# Patient Record
Sex: Female | Born: 1995 | Race: White | Hispanic: No | Marital: Single | Smoking: Never smoker
Health system: Southern US, Community
[De-identification: ages and names within clinical notes are randomized; demographics above are authoritative.]

## PROBLEM LIST (undated history)

## (undated) DIAGNOSIS — N390 Urinary tract infection, site not specified: Secondary | ICD-10-CM

## (undated) DIAGNOSIS — F32A Depression, unspecified: Secondary | ICD-10-CM

## (undated) DIAGNOSIS — F419 Anxiety disorder, unspecified: Secondary | ICD-10-CM

## (undated) DIAGNOSIS — F633 Trichotillomania: Secondary | ICD-10-CM

## (undated) DIAGNOSIS — F53 Postpartum depression: Secondary | ICD-10-CM

## (undated) HISTORY — DX: Anxiety disorder, unspecified: F41.9

## (undated) HISTORY — DX: Trichotillomania: F63.3

## (undated) HISTORY — DX: Depression, unspecified: F32.A

## (undated) HISTORY — PX: TONSILLECTOMY: SUR1361

## (undated) HISTORY — PX: ADENOIDECTOMY: SUR15

---
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
## 2016-09-16 ENCOUNTER — Emergency Department (HOSPITAL_COMMUNITY): Payer: BLUE CROSS/BLUE SHIELD

## 2016-09-16 ENCOUNTER — Encounter (HOSPITAL_COMMUNITY): Payer: Self-pay | Admitting: Emergency Medicine

## 2016-09-16 ENCOUNTER — Emergency Department (HOSPITAL_COMMUNITY)
Admission: EM | Admit: 2016-09-16 | Discharge: 2016-09-16 | Disposition: A | Discharge status: Home / Self Care | Payer: BLUE CROSS/BLUE SHIELD | Attending: Emergency Medicine | Admitting: Emergency Medicine

## 2016-09-16 DIAGNOSIS — R102 Pelvic and perineal pain: Secondary | ICD-10-CM | POA: Insufficient documentation

## 2016-09-16 DIAGNOSIS — O23591 Infection of other part of genital tract in pregnancy, first trimester: Secondary | ICD-10-CM | POA: Insufficient documentation

## 2016-09-16 DIAGNOSIS — O26891 Other specified pregnancy related conditions, first trimester: Secondary | ICD-10-CM | POA: Diagnosis present

## 2016-09-16 DIAGNOSIS — N760 Acute vaginitis: Secondary | ICD-10-CM

## 2016-09-16 DIAGNOSIS — B9689 Other specified bacterial agents as the cause of diseases classified elsewhere: Secondary | ICD-10-CM

## 2016-09-16 DIAGNOSIS — N3001 Acute cystitis with hematuria: Secondary | ICD-10-CM

## 2016-09-16 DIAGNOSIS — R52 Pain, unspecified: Secondary | ICD-10-CM

## 2016-09-16 DIAGNOSIS — O2311 Infections of bladder in pregnancy, first trimester: Secondary | ICD-10-CM | POA: Diagnosis not present

## 2016-09-16 DIAGNOSIS — Z3A01 Less than 8 weeks gestation of pregnancy: Secondary | ICD-10-CM | POA: Insufficient documentation

## 2016-09-16 HISTORY — DX: Urinary tract infection, site not specified: N39.0

## 2016-09-16 LAB — URINALYSIS, ROUTINE W REFLEX MICROSCOPIC
BILIRUBIN URINE: NEGATIVE
Glucose, UA: NEGATIVE mg/dL
Ketones, ur: NEGATIVE mg/dL
Leukocytes, UA: NEGATIVE
NITRITE: NEGATIVE
PH: 7.5 (ref 5.0–8.0)
Protein, ur: 30 mg/dL — AB
SPECIFIC GRAVITY, URINE: 1.01 (ref 1.005–1.030)

## 2016-09-16 LAB — CBC WITH DIFFERENTIAL/PLATELET
BASOS ABS: 0 10*3/uL (ref 0.0–0.1)
Basophils Relative: 0 %
EOS PCT: 2 %
Eosinophils Absolute: 0.2 10*3/uL (ref 0.0–0.7)
HEMATOCRIT: 40.9 % (ref 36.0–46.0)
Hemoglobin: 14.2 g/dL (ref 12.0–15.0)
LYMPHS ABS: 3.2 10*3/uL (ref 0.7–4.0)
LYMPHS PCT: 26 %
MCH: 31.4 pg (ref 26.0–34.0)
MCHC: 34.7 g/dL (ref 30.0–36.0)
MCV: 90.5 fL (ref 78.0–100.0)
MONO ABS: 1 10*3/uL (ref 0.1–1.0)
MONOS PCT: 9 %
NEUTROS ABS: 7.8 10*3/uL — AB (ref 1.7–7.7)
Neutrophils Relative %: 63 %
PLATELETS: 257 10*3/uL (ref 150–400)
RBC: 4.52 MIL/uL (ref 3.87–5.11)
RDW: 12.3 % (ref 11.5–15.5)
WBC: 12.3 10*3/uL — ABNORMAL HIGH (ref 4.0–10.5)

## 2016-09-16 LAB — WET PREP, GENITAL
Sperm: NONE SEEN
Trich, Wet Prep: NONE SEEN
Yeast Wet Prep HPF POC: NONE SEEN

## 2016-09-16 LAB — BASIC METABOLIC PANEL
ANION GAP: 7 (ref 5–15)
BUN: 12 mg/dL (ref 6–20)
CALCIUM: 9 mg/dL (ref 8.9–10.3)
CO2: 23 mmol/L (ref 22–32)
Chloride: 105 mmol/L (ref 101–111)
Creatinine, Ser: 0.67 mg/dL (ref 0.44–1.00)
GFR calc Af Amer: >60 mL/min (ref >60)
GFR calc non Af Amer: >60 mL/min (ref >60)
GLUCOSE: 74 mg/dL (ref 65–99)
Potassium: 3.5 mmol/L (ref 3.5–5.1)
Sodium: 135 mmol/L (ref 135–145)

## 2016-09-16 LAB — URINE MICROSCOPIC-ADD ON

## 2016-09-16 LAB — ABO/RH: ABO/RH(D): O POS

## 2016-09-16 LAB — PREGNANCY, URINE: PREG TEST UR: POSITIVE — AB

## 2016-09-16 LAB — HCG, QUANTITATIVE, PREGNANCY: hCG, Beta Chain, Quant, S: 19930 m[IU]/mL — ABNORMAL HIGH (ref <5)

## 2016-09-16 MED ORDER — NITROFURANTOIN MONOHYD MACRO 100 MG PO CAPS: 100.0000 mg | ORAL_CAPSULE | Freq: Two times a day (BID) | ORAL | 0 refills | Status: DC

## 2016-09-16 MED ORDER — PRENATAL COMPLETE 14-0.4 MG PO TABS: 1.0000 | ORAL_TABLET | Freq: Every day | ORAL | 0 refills | Status: DC

## 2016-09-16 MED ORDER — CEPHALEXIN 500 MG PO CAPS: 500.0000 mg | ORAL_CAPSULE | Freq: Four times a day (QID) | ORAL | 0 refills | Status: DC

## 2016-09-16 NOTE — ED Provider Notes (Signed)
[facility] DEPT Provider Note   CSN: [id number] Arrival date & time: 09/16/16  1505     History   Chief Complaint Chief Complaint  Patient presents with  . Hematuria    HPI Ashley Brady is a 20 y.o. female, G1P0, Recently found to be pregnant, stating she had a hCG test completed at her [facility] care's [facility] and was told she is roughly 4-5 weeks, her LMP being 6 weeks ago.  She has had a one-week history of intermittent low pelvic cramping which will last for 15-30 seconds, be quite severe and then will be pain-free and was to be triggered with more active activities like walking but can also occur at rest.  She also endorses darker than normal urine and several episodes of blood tinged toilet paper on wiping, but denies menstrual spotting as she only sees blood with urination.  She denies dysuria, increased frequency, or urgency.  She has no fevers or chills, denies low back pain.  She also denies vaginal discharge.  She does have a history of frequent UTIs and states that this pain is different from a UTI.  She has had no medications for her symptoms prior to arrival.  She is planning to establish OB care in 5 days [facility]  The history is provided by the patient and a parent.    Past Medical History:  Diagnosis Date  . Recurrent UTI     There are no active problems to display for this patient.   Past Surgical History:  Procedure Laterality Date  . TONSILLECTOMY      OB History    Gravida Para Term Preterm AB Living   1             SAB TAB Ectopic Multiple Live Births                   Home Medications    Prior to Admission medications   Medication Sig Start Date End Date Taking? Authorizing Provider  cephALEXin (KEFLEX) 500 MG capsule Take 1 capsule (500 mg total) by mouth 4 (four) times daily. 09/16/16   Burgess AmorJulie Alva Kuenzel, PA-C  HYDROcodone-acetaminophen (NORCO) 5-325 MG per tablet Take 1 tablet by mouth every  6 (six) hours as needed for moderate pain. 11/17/13   Ivery QualeHobson Bryant, PA-C  ibuprofen (ADVIL,MOTRIN) 800 MG tablet Take 1 tablet (800 mg total) by mouth 3 (three) times daily. 11/17/13   Ivery QualeHobson Bryant, PA-C  Prenatal Vit-Fe Fumarate-FA (PRENATAL COMPLETE) 14-0.4 MG TABS Take 1 tablet by mouth daily. 09/16/16   Burgess AmorJulie Quanika Solem, PA-C    Family History Family History  Problem Relation Age of Onset  . Thyroid disease Mother     Social History Social History  Substance Use Topics  . Smoking status: Never Smoker  . Smokeless tobacco: Never Used  . Alcohol use No     Allergies   Sulfa antibiotics   Review of Systems Review of Systems  Constitutional: Negative for appetite change and fever.  HENT: Negative for congestion and sore throat.   Eyes: Negative.   Respiratory: Negative for chest tightness and shortness of breath.   Cardiovascular: Negative for chest pain.  Gastrointestinal: Negative for abdominal pain, nausea and vomiting.  Genitourinary: Positive for hematuria and pelvic pain. Negative for dysuria, frequency, urgency, vaginal discharge and vaginal pain.  Musculoskeletal: Negative for arthralgias, back pain, joint swelling and neck pain.  Skin: Negative.  Negative for rash and wound.  Neurological:  Negative for dizziness, weakness, light-headedness, numbness and headaches.  Psychiatric/Behavioral: Negative.      Physical Exam Updated Vital Signs BP 127/59 (BP Location: Right Arm)   Pulse 66   Temp 98.6 F (37 C) (Oral)   Resp 18   Ht 5\' 9"  (1.753 m)   Wt 97.1 kg   LMP 08/03/2016   SpO2 100%   BMI 31.60 kg/m   Physical Exam  Constitutional: She appears well-developed and well-nourished.  HENT:  Head: Normocephalic and atraumatic.  Eyes: Conjunctivae are normal.  Neck: Normal range of motion.  Cardiovascular: Normal rate, regular rhythm, normal heart sounds and intact distal pulses.   Pulmonary/Chest: Effort normal and breath sounds normal. She has no wheezes.    Abdominal: Soft. Bowel sounds are normal. There is no tenderness.  Genitourinary: Vagina normal and uterus normal. Uterus is not tender. Cervix exhibits no motion tenderness, no discharge and no friability. Right adnexum displays no tenderness. Left adnexum displays no tenderness. No tenderness or bleeding in the vagina. No vaginal discharge found.  Musculoskeletal: Normal range of motion.  Neurological: She is alert.  Skin: Skin is warm and dry.  Psychiatric: She has a normal mood and affect.  Nursing note and vitals reviewed.    ED Treatments / Results  Labs Results for orders placed or performed during the hospital encounter of 09/16/16  Wet prep, genital  Result Value Ref Range   Yeast Wet Prep HPF POC NONE SEEN NONE SEEN   Trich, Wet Prep NONE SEEN NONE SEEN   Clue Cells Wet Prep HPF POC PRESENT (A) NONE SEEN   WBC, Wet Prep HPF POC MANY (A) NONE SEEN   Sperm NONE SEEN   Urinalysis, Routine w reflex microscopic- may I&O cath if menses  Result Value Ref Range   Color, Urine YELLOW YELLOW   APPearance HAZY (A) CLEAR   Specific Gravity, Urine 1.010 1.005 - 1.030   pH 7.5 5.0 - 8.0   Glucose, UA NEGATIVE NEGATIVE mg/dL   Hgb urine dipstick LARGE (A) NEGATIVE   Bilirubin Urine NEGATIVE NEGATIVE   Ketones, ur NEGATIVE NEGATIVE mg/dL   Protein, ur 30 (A) NEGATIVE mg/dL   Nitrite NEGATIVE NEGATIVE   Leukocytes, UA NEGATIVE NEGATIVE  Pregnancy, urine  Result Value Ref Range   Preg Test, Ur POSITIVE (A) NEGATIVE  Urine microscopic-add on  Result Value Ref Range   Squamous Epithelial / LPF 6-30 (A) NONE SEEN   WBC, UA 0-5 0 - 5 WBC/hpf   RBC / HPF 6-30 0 - 5 RBC/hpf   Bacteria, UA MANY (A) NONE SEEN   Urine-Other AMORPHOUS URATES/PHOSPHATES   hCG, quantitative, pregnancy  Result Value Ref Range   hCG, Beta Chain, Quant, S 19,930 (H) <5 mIU/mL  CBC with Differential  Result Value Ref Range   WBC 12.3 (H) 4.0 - 10.5 K/uL   RBC 4.52 3.87 - 5.11 MIL/uL   Hemoglobin 14.2 12.0  - 15.0 g/dL   HCT 16.1 09.6 - 04.5 %   MCV 90.5 78.0 - 100.0 fL   MCH 31.4 26.0 - 34.0 pg   MCHC 34.7 30.0 - 36.0 g/dL   RDW 40.9 81.1 - 91.4 %   Platelets 257 150 - 400 K/uL   Neutrophils Relative % 63 %   Neutro Abs 7.8 (H) 1.7 - 7.7 K/uL   Lymphocytes Relative 26 %   Lymphs Abs 3.2 0.7 - 4.0 K/uL   Monocytes Relative 9 %   Monocytes Absolute 1.0 0.1 - 1.0 K/uL  Eosinophils Relative 2 %   Eosinophils Absolute 0.2 0.0 - 0.7 K/uL   Basophils Relative 0 %   Basophils Absolute 0.0 0.0 - 0.1 K/uL  Basic metabolic panel  Result Value Ref Range   Sodium 135 135 - 145 mmol/L   Potassium 3.5 3.5 - 5.1 mmol/L   Chloride 105 101 - 111 mmol/L   CO2 23 22 - 32 mmol/L   Glucose, Bld 74 65 - 99 mg/dL   BUN 12 6 - 20 mg/dL   Creatinine, Ser 1.61 0.44 - 1.00 mg/dL   Calcium 9.0 8.9 - 09.6 mg/dL   GFR calc non Af Amer >60 >60 mL/min   GFR calc Af Amer >60 >60 mL/min   Anion gap 7 5 - 15  ABO/Rh  Result Value Ref Range   ABO/RH(D) O POS    No rh immune globuloin NOT A RH IMMUNE GLOBULIN CANDIDATE, PT RH POSITIVE    US Ob Comp Less 14 Wks  Result Date: 09/16/2016 CLINICAL DATA:  Pelvic pain, cramping, and hematuria for 1 week. Gestational age by LMP of 5 weeks 6 days. EXAM: OBSTETRIC <14 WK [place] AND TRANSVAGINAL OB US TECHNIQUE: Both transabdominal and transvaginal ultrasound examinations were performed for complete evaluation of the gestation as well as the maternal uterus, adnexal regions, and pelvic cul-de-sac. Transvaginal technique was performed to assess early pregnancy. COMPARISON:  None. FINDINGS: Intrauterine gestational sac: Single Yolk sac:  Visualized Embryo:  Visualized Cardiac Activity: Visualized Heart Rate: 150  bpm CRL:  6  mm   6 w   2 d                  [place] EDC: 05/10/2017 Subchorionic hemorrhage:  None visualized. Maternal uterus/adnexae: Both ovaries are normal in appearance. No evidence of mass or free fluid. IMPRESSION: Single living IUP measuring 6 weeks 2 days with [place] EDC  of 05/10/2017. This is concordant with LMP. No significant maternal uterine or adnexal abnormality identified. Electronically Signed   By: Myles Rosenthal M.D.   On: 09/16/2016 20:01   US Ob Transvaginal  Result Date: 09/16/2016 CLINICAL DATA:  Pelvic pain, cramping, and hematuria for 1 week. Gestational age by LMP of 5 weeks 6 days. EXAM: OBSTETRIC <14 WK [place] AND TRANSVAGINAL OB US TECHNIQUE: Both transabdominal and transvaginal ultrasound examinations were performed for complete evaluation of the gestation as well as the maternal uterus, adnexal regions, and pelvic cul-de-sac. Transvaginal technique was performed to assess early pregnancy. COMPARISON:  None. FINDINGS: Intrauterine gestational sac: Single Yolk sac:  Visualized Embryo:  Visualized Cardiac Activity: Visualized Heart Rate: 150  bpm CRL:  6  mm   6 w   2 d                  [place] EDC: 05/10/2017 Subchorionic hemorrhage:  None visualized. Maternal uterus/adnexae: Both ovaries are normal in appearance. No evidence of mass or free fluid. IMPRESSION: Single living IUP measuring 6 weeks 2 days with [place] EDC of 05/10/2017. This is concordant with LMP. No significant maternal uterine or adnexal abnormality identified. Electronically Signed   By: Myles Rosenthal M.D.   On: 09/16/2016 20:01    EKG  EKG Interpretation None       Radiology US Ob Comp Less 14 Wks  Result Date: 09/16/2016 CLINICAL DATA:  Pelvic pain, cramping, and hematuria for 1 week. Gestational age by LMP of 5 weeks 6 days. EXAM: OBSTETRIC <14 WK [place] AND TRANSVAGINAL OB US TECHNIQUE: Both transabdominal and transvaginal  ultrasound examinations were performed for complete evaluation of the gestation as well as the maternal uterus, adnexal regions, and pelvic cul-de-sac. Transvaginal technique was performed to assess early pregnancy. COMPARISON:  None. FINDINGS: Intrauterine gestational sac: Single Yolk sac:  Visualized Embryo:  Visualized Cardiac Activity: Visualized Heart Rate: 150  bpm CRL:   6  mm   6 w   2 d                  [place] EDC: 05/10/2017 Subchorionic hemorrhage:  None visualized. Maternal uterus/adnexae: Both ovaries are normal in appearance. No evidence of mass or free fluid. IMPRESSION: Single living IUP measuring 6 weeks 2 days with [place] EDC of 05/10/2017. This is concordant with LMP. No significant maternal uterine or adnexal abnormality identified. Electronically Signed   By: Myles Rosenthal M.D.   On: 09/16/2016 20:01   US Ob Transvaginal  Result Date: 09/16/2016 CLINICAL DATA:  Pelvic pain, cramping, and hematuria for 1 week. Gestational age by LMP of 5 weeks 6 days. EXAM: OBSTETRIC <14 WK [place] AND TRANSVAGINAL OB US TECHNIQUE: Both transabdominal and transvaginal ultrasound examinations were performed for complete evaluation of the gestation as well as the maternal uterus, adnexal regions, and pelvic cul-de-sac. Transvaginal technique was performed to assess early pregnancy. COMPARISON:  None. FINDINGS: Intrauterine gestational sac: Single Yolk sac:  Visualized Embryo:  Visualized Cardiac Activity: Visualized Heart Rate: 150  bpm CRL:  6  mm   6 w   2 d                  [place] EDC: 05/10/2017 Subchorionic hemorrhage:  None visualized. Maternal uterus/adnexae: Both ovaries are normal in appearance. No evidence of mass or free fluid. IMPRESSION: Single living IUP measuring 6 weeks 2 days with [place] EDC of 05/10/2017. This is concordant with LMP. No significant maternal uterine or adnexal abnormality identified. Electronically Signed   By: Myles Rosenthal M.D.   On: 09/16/2016 20:01    Procedures Procedures (including critical care time)  Medications Ordered in ED Medications - No data to display   Initial Impression / Assessment and Plan / ED Course  I have reviewed the triage vital signs and the nursing notes.  Pertinent labs & imaging results that were available during my care of the patient were reviewed by me and considered in my medical decision making (see chart for  details).  Clinical Course    Labs and imaging reviewed and discussed with patient. She was placed on keflex for uti, culture pending, also prescribed prenatal vitamin. Discussed clue cells finding and she will probably need tx for this, but will defer to her prenatal provider next week. She was given copy of her labs and [place] report to give to her new provider and advised to discuss this tx. Questions answered.  Pt stable at time of dc.   Final Clinical Impressions(s) / ED Diagnoses   Final diagnoses:  Pelvic pain  Acute cystitis with hematuria  Bacterial vaginosis  Less than [redacted] weeks gestation of pregnancy    New Prescriptions Discharge Medication List as of 09/16/2016  9:09 PM    START taking these medications   Details  cephALEXin (KEFLEX) 500 MG capsule Take 1 capsule (500 mg total) by mouth 4 (four) times daily., Starting Thu 09/16/2016, Print         Burgess Amor, PA-C 09/17/16 9604    Pricilla Loveless, MD 09/21/16 2328

## 2016-09-16 NOTE — ED Triage Notes (Signed)
Pt reports abdominal cramping and hematuria that started 1 week ago. Pt states she is [redacted] weeks pregnant.

## 2016-09-17 LAB — GC/CHLAMYDIA PROBE AMP (~~LOC~~) NOT AT ARMC
CHLAMYDIA, DNA PROBE: NEGATIVE
Neisseria Gonorrhea: NEGATIVE

## 2016-09-18 LAB — URINE CULTURE: Special Requests: NORMAL

## 2017-06-05 IMAGING — US US OB COMP LESS 14 WK
1 series · 14 of 28 positions shown · non-contrast
Comparison: None.

CLINICAL DATA: Pelvic pain, cramping, and hematuria for 1 week.
Gestational age by LMP of 5 weeks 6 days.

EXAM:
OBSTETRIC <14 WK US AND TRANSVAGINAL OB US
TECHNIQUE: Both transabdominal and transvaginal ultrasound examinations were
performed for complete evaluation of the gestation as well as the
maternal uterus, adnexal regions, and pelvic cul-de-sac.
Transvaginal technique was performed to assess early pregnancy.

[Series 1: us ob comp less 14 wk · 0.25mm/px · 14 of 45 slices shown]
[im 2/45]
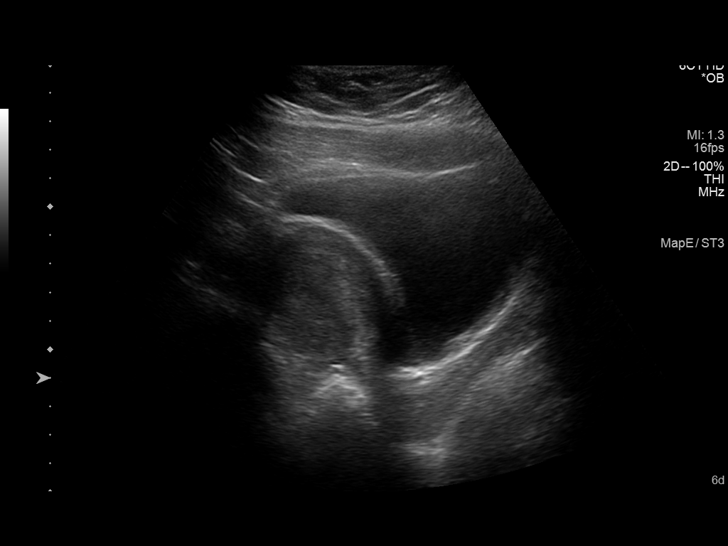
[im 5/45]
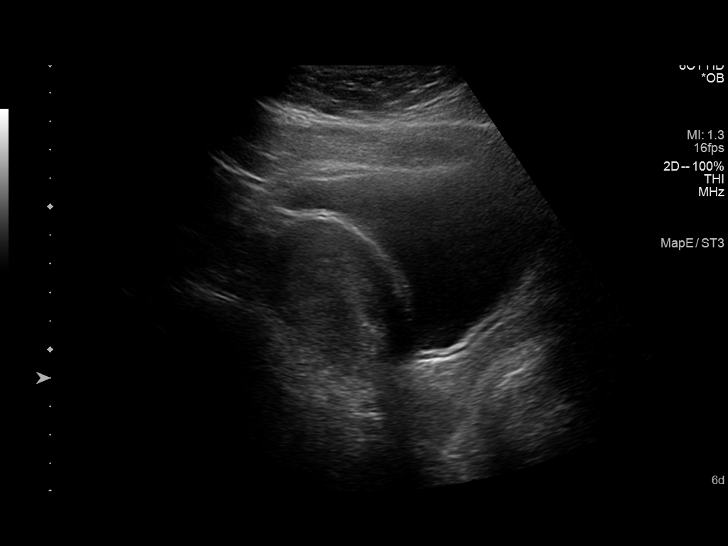
[im 9/45]
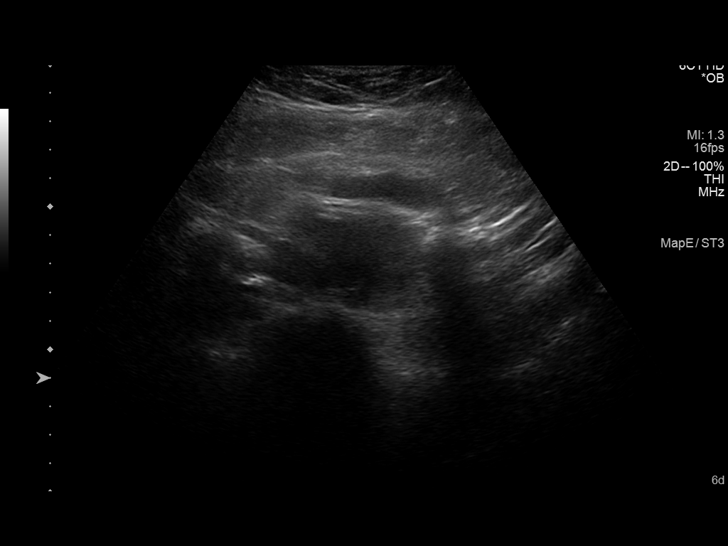
[im 12/45]
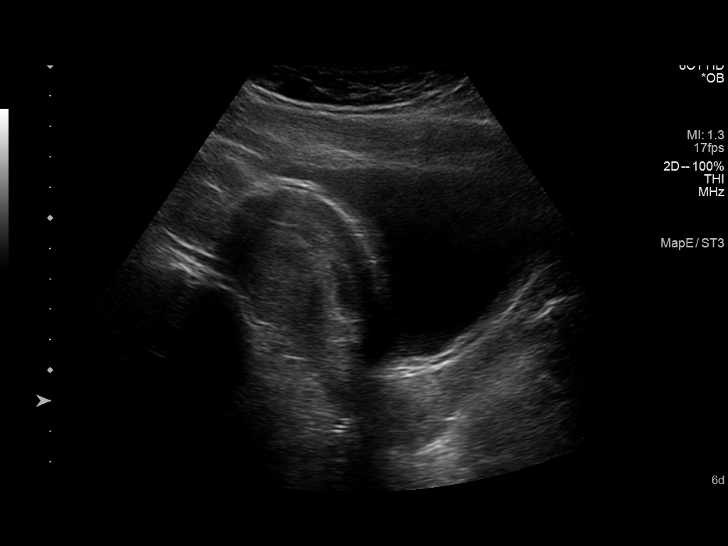
[im 15/45]
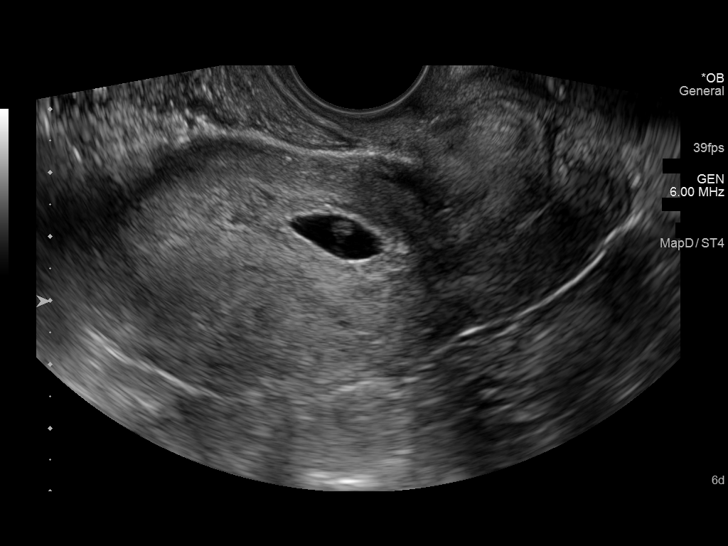
[im 18/45]
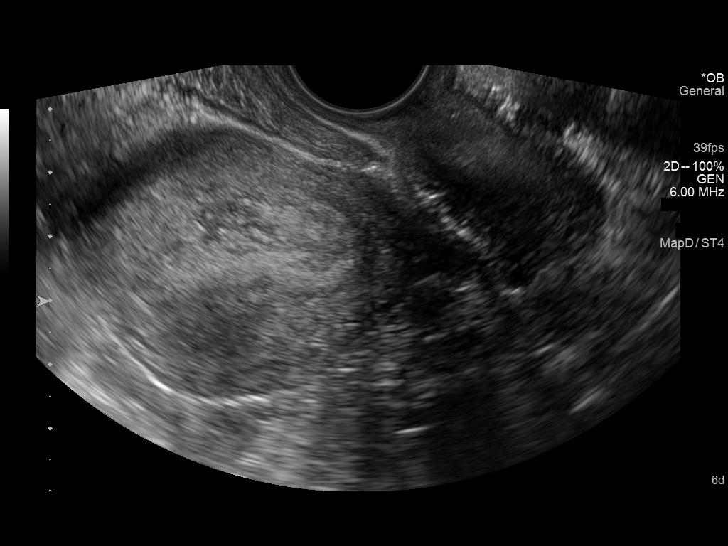
[im 22/45]
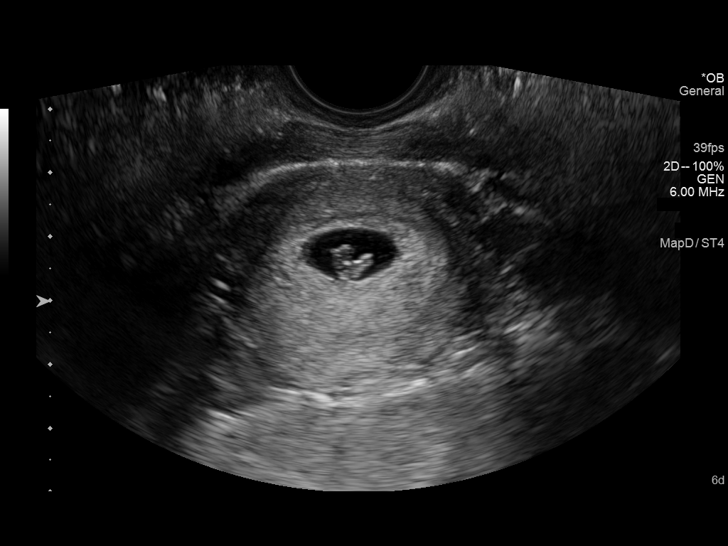
[im 25/45]
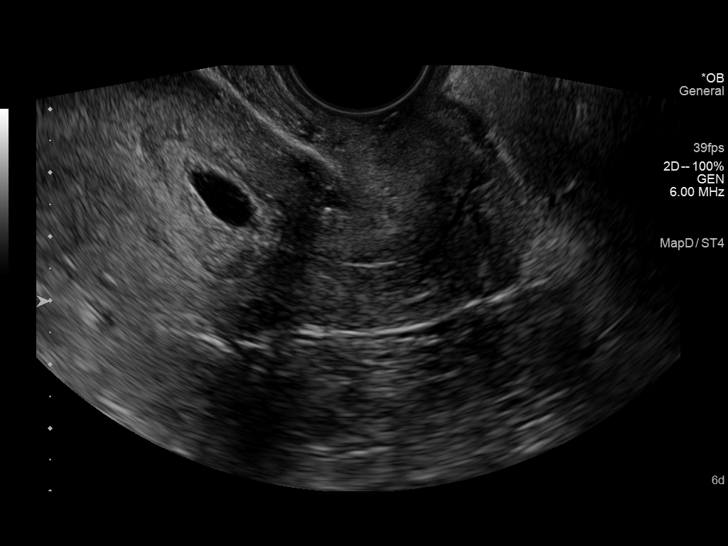
[im 28/45]
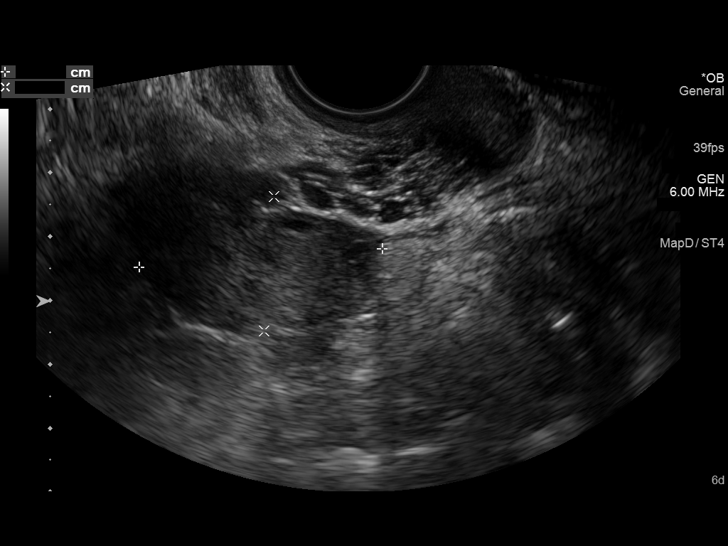
[im 31/45]
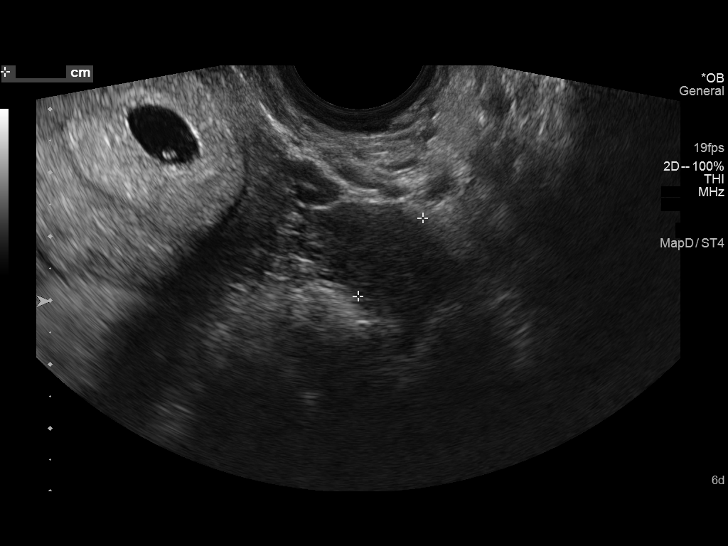
[im 35/45]
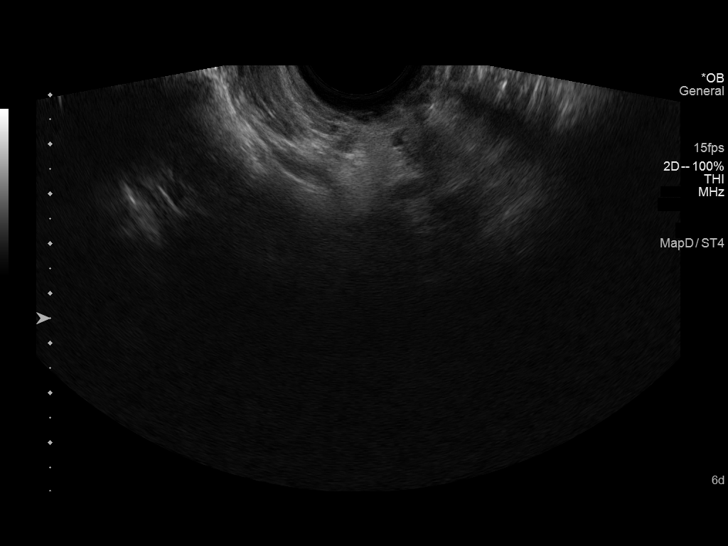
[im 38/45]
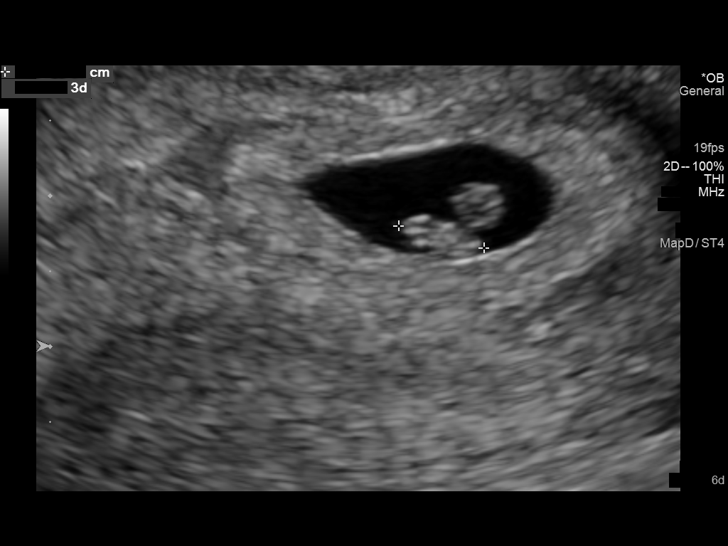
[im 41/45]
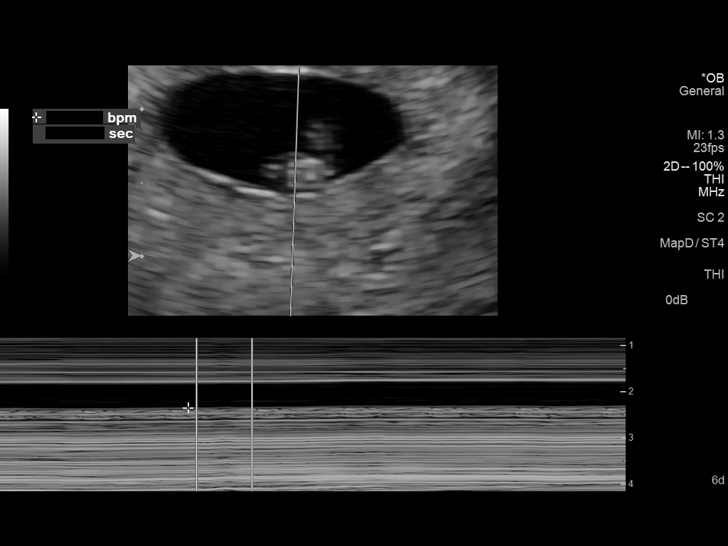
[im 45/45]
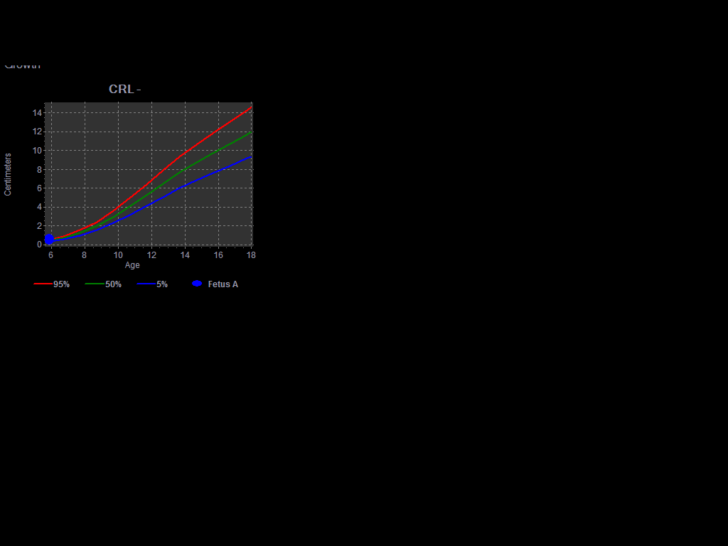

[14 of 28 positions shown; findings below may reference images not displayed]

FINDINGS: Intrauterine gestational sac: Single

Yolk sac:  Visualized

Embryo:  Visualized

Cardiac Activity: Visualized

Heart Rate: 150  bpm

CRL:  6  mm   6 w   2 d                  US EDC: 05/10/2017

Subchorionic hemorrhage:  None visualized.

Maternal uterus/adnexae: Both ovaries are normal in appearance. No
evidence of mass or free fluid.
IMPRESSION: Single living IUP measuring 6 weeks 2 days with US EDC of
05/10/2017. This is concordant with LMP.

No significant maternal uterine or adnexal abnormality identified.

## 2020-03-18 ENCOUNTER — Encounter (HOSPITAL_COMMUNITY): Payer: Self-pay

## 2020-03-18 ENCOUNTER — Other Ambulatory Visit: Payer: Self-pay

## 2020-03-18 ENCOUNTER — Emergency Department (HOSPITAL_COMMUNITY)
Admission: EM | Admit: 2020-03-18 | Discharge: 2020-03-18 | Disposition: A | Discharge status: Home / Self Care | Payer: Medicaid Other | Attending: Emergency Medicine | Admitting: Emergency Medicine

## 2020-03-18 DIAGNOSIS — F419 Anxiety disorder, unspecified: Secondary | ICD-10-CM | POA: Insufficient documentation

## 2020-03-18 DIAGNOSIS — R079 Chest pain, unspecified: Secondary | ICD-10-CM | POA: Diagnosis present

## 2020-03-18 DIAGNOSIS — F333 Major depressive disorder, recurrent, severe with psychotic symptoms: Secondary | ICD-10-CM | POA: Diagnosis not present

## 2020-03-18 DIAGNOSIS — F32A Depression, unspecified: Secondary | ICD-10-CM

## 2020-03-18 DIAGNOSIS — F329 Major depressive disorder, single episode, unspecified: Secondary | ICD-10-CM

## 2020-03-18 HISTORY — DX: Postpartum depression: F53.0

## 2020-03-18 LAB — COMPREHENSIVE METABOLIC PANEL
ALT: 21 U/L (ref 0–44)
AST: 24 U/L (ref 15–41)
Albumin: 4.2 g/dL (ref 3.5–5.0)
Alkaline Phosphatase: 64 U/L (ref 38–126)
Anion gap: 8 (ref 5–15)
BUN: 13 mg/dL (ref 6–20)
CO2: 24 mmol/L (ref 22–32)
Calcium: 8.7 mg/dL — ABNORMAL LOW (ref 8.9–10.3)
Chloride: 106 mmol/L (ref 98–111)
Creatinine, Ser: 0.82 mg/dL (ref 0.44–1.00)
GFR calc Af Amer: >60 mL/min (ref >60)
GFR calc non Af Amer: >60 mL/min (ref >60)
Glucose, Bld: 86 mg/dL (ref 70–99)
Potassium: 4.2 mmol/L (ref 3.5–5.1)
Sodium: 138 mmol/L (ref 135–145)
Total Bilirubin: 0.8 mg/dL (ref 0.3–1.2)
Total Protein: 7.7 g/dL (ref 6.5–8.1)

## 2020-03-18 LAB — ETHANOL: Alcohol, Ethyl (B): <10 mg/dL (ref <10)

## 2020-03-18 LAB — RAPID URINE DRUG SCREEN, HOSP PERFORMED
Amphetamines: NOT DETECTED
Barbiturates: NOT DETECTED
Benzodiazepines: NOT DETECTED
Cocaine: NOT DETECTED
Opiates: NOT DETECTED
Tetrahydrocannabinol: POSITIVE — AB

## 2020-03-18 LAB — CBC WITH DIFFERENTIAL/PLATELET
Abs Immature Granulocytes: 0.03 10*3/uL (ref 0.00–0.07)
Basophils Absolute: 0 10*3/uL (ref 0.0–0.1)
Basophils Relative: 0 %
Eosinophils Absolute: 0.2 10*3/uL (ref 0.0–0.5)
Eosinophils Relative: 2 %
HCT: 40.7 % (ref 36.0–46.0)
Hemoglobin: 13.5 g/dL (ref 12.0–15.0)
Immature Granulocytes: 0 %
Lymphocytes Relative: 30 %
Lymphs Abs: 2.4 10*3/uL (ref 0.7–4.0)
MCH: 30.8 pg (ref 26.0–34.0)
MCHC: 33.2 g/dL (ref 30.0–36.0)
MCV: 92.9 fL (ref 80.0–100.0)
Monocytes Absolute: 0.6 10*3/uL (ref 0.1–1.0)
Monocytes Relative: 8 %
Neutro Abs: 4.7 10*3/uL (ref 1.7–7.7)
Neutrophils Relative %: 60 %
Platelets: 248 10*3/uL (ref 150–400)
RBC: 4.38 MIL/uL (ref 3.87–5.11)
RDW: 11.7 % (ref 11.5–15.5)
WBC: 7.9 10*3/uL (ref 4.0–10.5)
nRBC: 0 % (ref 0.0–0.2)

## 2020-03-18 LAB — URINALYSIS, ROUTINE W REFLEX MICROSCOPIC
Bacteria, UA: NONE SEEN
Bilirubin Urine: NEGATIVE
Glucose, UA: NEGATIVE mg/dL
Ketones, ur: NEGATIVE mg/dL
Leukocytes,Ua: NEGATIVE
Nitrite: NEGATIVE
Protein, ur: NEGATIVE mg/dL
Specific Gravity, Urine: 1.021 (ref 1.005–1.030)
pH: 7 (ref 5.0–8.0)

## 2020-03-18 LAB — TSH: TSH: 1.083 u[IU]/mL (ref 0.350–4.500)

## 2020-03-18 LAB — POC URINE PREG, ED: Preg Test, Ur: NEGATIVE

## 2020-03-18 MED ORDER — ALUM & MAG HYDROXIDE-SIMETH 200-200-20 MG/5ML PO SUSP: 30.0000 mL | Freq: Four times a day (QID) | ORAL | Status: DC | PRN

## 2020-03-18 MED ORDER — PAROXETINE HCL 20 MG PO TABS: 20.0000 mg | ORAL_TABLET | Freq: Every day | ORAL | 0 refills | Status: AC

## 2020-03-18 MED ORDER — IBUPROFEN 400 MG PO TABS: 600.0000 mg | ORAL_TABLET | Freq: Three times a day (TID) | ORAL | Status: DC | PRN

## 2020-03-18 MED ORDER — ONDANSETRON HCL 4 MG PO TABS: 4.0000 mg | ORAL_TABLET | Freq: Three times a day (TID) | ORAL | Status: DC | PRN

## 2020-03-18 MED ORDER — ZOLPIDEM TARTRATE 5 MG PO TABS: 5.0000 mg | ORAL_TABLET | Freq: Every evening | ORAL | Status: DC | PRN

## 2020-03-18 NOTE — ED Notes (Signed)
Pt states she has to leave to go talk to her daughter.  edp at bedside and spoke with pt
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
## 2020-03-18 NOTE — ED Triage Notes (Signed)
Pt reports history of post partem depression and had anxiety attacks.  Reports was on lexapro.  Pt says she stopped the lexapro herself 2 years ago.    Pt says for the past month she's been under a lot of stress and has been having chest pain and sob.  Pt also says her r foot was numb this morning that started approx 1 hour ago.  Pt says it has improved but still feels a little numb.

## 2023-12-20 ENCOUNTER — Encounter: Payer: Self-pay | Admitting: Internal Medicine

## 2024-01-08 ENCOUNTER — Encounter: Payer: Self-pay | Admitting: Gastroenterology

## 2024-01-08 NOTE — Progress Notes (Deleted)
 GI Office Note    Referring Provider: Sharlene Dory, NP Primary Care Physician:  Sharlene Dory, NP  Primary Gastroenterologist:  Chief Complaint   No chief complaint on file.    History of Present Illness   Ashley Brady is a 28 y.o. female presenting today at the request of Sharlene Dory NP for abdominal discomfort.          Medications   Current Outpatient Medications  Medication Sig Dispense Refill   PARoxetine (PAXIL) 20 MG tablet Take 1 tablet (20 mg total) by mouth daily. 30 tablet 0   No current facility-administered medications for this visit.    Allergies   Allergies as of 01/09/2024 - Review Complete 03/18/2020  Allergen Reaction Noted   Sulfa antibiotics  11/16/2013    Past Medical History   Past Medical History:  Diagnosis Date   Post partum depression    Recurrent UTI     Past Surgical History   Past Surgical History:  Procedure Laterality Date   ADENOIDECTOMY     TONSILLECTOMY      Past Family History   Family History  Problem Relation Age of Onset   Thyroid disease Mother     Past Social History   Social History   Socioeconomic History   Marital status: Single    Spouse name: Not on file   Number of children: Not on file   Years of education: Not on file   Highest education level: Not on file  Occupational History   Not on file  Tobacco Use   Smoking status: Never   Smokeless tobacco: Never  Substance and Sexual Activity   Alcohol use: No   Drug use: No   Sexual activity: Yes    Birth control/protection: None  Other Topics Concern   Not on file  Social History Narrative   Not on file   Social Drivers of Health   Financial Resource Strain: Not on file  Food Insecurity: Not on file  Transportation Needs: Not on file  Physical Activity: Not on file  Stress: Not on file  Social Connections: Not on file  Intimate Partner Violence: Not At Risk (01/18/2023)   Received from [facility]    Humiliation, Afraid, Rape, and Kick questionnaire    Fear of Current or Ex-Partner: No    Emotionally Abused: No    Physically Abused: No    Sexually Abused: No    Review of Systems   General: Negative for anorexia, weight loss, fever, chills, fatigue, weakness. Eyes: Negative for vision changes.  ENT: Negative for hoarseness, difficulty swallowing , nasal congestion. CV: Negative for chest pain, angina, palpitations, dyspnea on exertion, peripheral edema.  Respiratory: Negative for dyspnea at rest, dyspnea on exertion, cough, sputum, wheezing.  GI: See history of present illness. GU:  Negative for dysuria, hematuria, urinary incontinence, urinary frequency, nocturnal urination.  MS: Negative for joint pain, low back pain.  Derm: Negative for rash or itching.  Neuro: Negative for weakness, abnormal sensation, seizure, frequent headaches, memory loss,  confusion.  Psych: Negative for anxiety, depression, suicidal ideation, hallucinations.  Endo: Negative for unusual weight change.  Heme: Negative for bruising or bleeding. Allergy: Negative for rash or hives.  Physical Exam   There were no vitals taken for this visit.   General: Well-nourished, well-developed in no acute distress.  Head: Normocephalic, atraumatic.   Eyes: Conjunctiva pink, no icterus. Mouth: Oropharyngeal mucosa moist and pink , no lesions erythema or exudate. Neck: Supple  without thyromegaly, masses, or lymphadenopathy.  Lungs: Clear to auscultation bilaterally.  Heart: Regular rate and rhythm, no murmurs rubs or gallops.  Abdomen: Bowel sounds are normal, nontender, nondistended, no hepatosplenomegaly or masses,  no abdominal bruits or hernia, no rebound or guarding.   Rectal: *** Extremities: No lower extremity edema. No clubbing or deformities.  Neuro: Alert and oriented x 4 , grossly normal neurologically.  Skin: Warm and dry, no rash or jaundice.   Psych: Alert and cooperative, normal mood and  affect.  Labs   *** Imaging Studies   No results found.  Assessment       PLAN   ***   Leanna Battles. Melvyn Neth, MHS, PA-C [facility]

## 2024-01-09 ENCOUNTER — Ambulatory Visit: Payer: MEDICAID | Admitting: Gastroenterology

## 2024-01-09 ENCOUNTER — Encounter: Payer: Self-pay | Admitting: Gastroenterology

## 2024-01-18 ENCOUNTER — Encounter: Payer: Self-pay | Admitting: Gastroenterology

## 2024-01-18 NOTE — Progress Notes (Unsigned)
GI Office Note    Referring Provider: Sharlene Dory, NP Primary Care Physician:  Sharlene Dory, NP  Primary Gastroenterologist: Hennie Duos. Marletta Lor, DO  Chief Complaint   No chief complaint on file.   History of Present Illness   Ashley Brady is a 28 y.o. female presenting today at the request of Joelin, Pincus Large, NP for abdominal discomfort.  Per review of referral paperwork during her most recent visit with PCP she  Stomach discomfort and feeling of her stomach flipping over but also has had increased anxiety recently as well. Reported stomach not the same since C section 2 years oriur, She also reported waking up with acid to the point where it made her sick. Had also reported nausea that zofran was not helping muhc. Tums helps with acid feeling.   Today:    Wt Readings from Last 3 Encounters:  03/18/20 220 lb (99.8 kg)  09/16/16 214 lb (97.1 kg)  11/16/13 187 lb (84.8 kg) (96%, Z= 1.78)*   * Growth percentiles are based on CDC (Girls, 2-20 Years) data.    Current Outpatient Medications  Medication Sig Dispense Refill   PARoxetine (PAXIL) 20 MG tablet Take 1 tablet (20 mg total) by mouth daily. 30 tablet 0   No current facility-administered medications for this visit.    Past Medical History:  Diagnosis Date   Anxiety    Depression    Post partum depression    Recurrent UTI    Trichotillomania     Past Surgical History:  Procedure Laterality Date   ADENOIDECTOMY     CESAREAN SECTION     TONSILLECTOMY      Family History  Problem Relation Age of Onset   Thyroid disease Mother    Other Father        overdose   Diabetes Maternal Grandmother    Melanoma Maternal Grandmother    Stroke Maternal Grandfather     Allergies as of 01/19/2024 - Review Complete 03/18/2020  Allergen Reaction Noted   Sulfa antibiotics  11/16/2013    Social History   Socioeconomic History   Marital status: Single    Spouse name: Not on file   Number of  children: Not on file   Years of education: Not on file   Highest education level: Not on file  Occupational History   Not on file  Tobacco Use   Smoking status: Never   Smokeless tobacco: Never  Substance and Sexual Activity   Alcohol use: No   Drug use: No   Sexual activity: Yes    Birth control/protection: None  Other Topics Concern   Not on file  Social History Narrative   Not on file   Social Drivers of Health   Financial Resource Strain: Not on file  Food Insecurity: Not on file  Transportation Needs: Not on file  Physical Activity: Not on file  Stress: Not on file  Social Connections: Not on file  Intimate Partner Violence: Not At Risk (01/18/2023)   Received from [facility]   Humiliation, Afraid, Rape, and Kick questionnaire    Fear of Current or Ex-Partner: No    Emotionally Abused: No    Physically Abused: No    Sexually Abused: No     Review of Systems   Gen: Denies any fever, chills, fatigue, weight loss, lack of appetite.  CV: Denies chest pain, heart palpitations, peripheral edema, syncope.  Resp: Denies shortness of breath at rest or with exertion. Denies wheezing or cough.  GI: see HPI GU : Denies urinary burning, urinary frequency, urinary hesitancy MS: Denies joint pain, muscle weakness, cramps, or limitation of movement.  Derm: Denies rash, itching, dry skin Psych: + depression, anxiety. Denies memory loss, and confusion Heme: Denies bruising, bleeding, and enlarged lymph nodes.  Physical Exam   There were no vitals taken for this visit.  General:   Alert and oriented. Pleasant and cooperative. Well-nourished and well-developed.  Head:  Normocephalic and atraumatic. Eyes:  Without icterus, sclera clear and conjunctiva pink.  Ears:  Normal auditory acuity. Mouth:  No deformity or lesions, oral mucosa pink.  Lungs:  Clear to auscultation bilaterally. No wheezes, rales, or rhonchi. No distress.  Heart:  S1, S2 present without murmurs  appreciated.  Abdomen:  +BS, soft, non-tender and non-distended. No HSM noted. No guarding or rebound. No masses appreciated.  Rectal:  Deferred *** Msk:  Symmetrical without gross deformities. Normal posture. Extremities:  Without edema. Neurologic:  Alert and  oriented x4;  grossly normal neurologically. Skin:  Intact without significant lesions or rashes. Psych:  Alert and cooperative. Normal mood and affect.  Assessment   Ashley Brady is a 28 y.o. female with a history of anxiety, depression,  presenting today with   Epigastric discomfort, nausea, GERD:   PLAN   *** H. Pylori breath test, celiac labs? Start omeprazole after breath testing    Brooke Bonito, MSN, FNP-BC, AGACNP-BC [facility]

## 2024-01-19 ENCOUNTER — Encounter: Payer: Self-pay | Admitting: Gastroenterology

## 2024-01-19 ENCOUNTER — Telehealth: Payer: Self-pay | Admitting: *Deleted

## 2024-01-19 ENCOUNTER — Ambulatory Visit: Payer: MEDICAID | Admitting: Gastroenterology

## 2024-01-19 ENCOUNTER — Telehealth: Payer: Self-pay | Admitting: Internal Medicine

## 2024-01-19 VITALS — BP 136/88 | HR 84 | Temp 97.7°F | Ht 69.0 in | Wt 188.4 lb

## 2024-01-19 DIAGNOSIS — R112 Nausea with vomiting, unspecified: Secondary | ICD-10-CM

## 2024-01-19 DIAGNOSIS — K5900 Constipation, unspecified: Secondary | ICD-10-CM

## 2024-01-19 DIAGNOSIS — R1013 Epigastric pain: Secondary | ICD-10-CM | POA: Diagnosis not present

## 2024-01-19 DIAGNOSIS — R1033 Periumbilical pain: Secondary | ICD-10-CM

## 2024-01-19 DIAGNOSIS — R110 Nausea: Secondary | ICD-10-CM | POA: Diagnosis not present

## 2024-01-19 DIAGNOSIS — K219 Gastro-esophageal reflux disease without esophagitis: Secondary | ICD-10-CM | POA: Diagnosis not present

## 2024-01-19 NOTE — Telephone Encounter (Signed)
Pt informed that CT is scheduled for Monday 01/30/24, arrive at 7:30 am to start drinking oral contrast. Verbalized understanding

## 2024-01-19 NOTE — Telephone Encounter (Signed)
Patient returned your call... she left a message at the front desk.

## 2024-01-19 NOTE — Telephone Encounter (Signed)
LMTRC

## 2024-01-19 NOTE — Patient Instructions (Addendum)
For nausea/reflux: Please go to the lab to have the H. pylori breath test done.  2 locations for [facility] in [place]:             1. [address], [place]  2. [address] [place], [place]  Take famotidine 20 mg once daily for burning/tenderness in upper abdomen. You can get this from [place], [facility], [facility].  You do not have to get namebrand, generic is fine. Continue to follow a GERD diet:  Avoid fried, fatty, greasy, spicy, citrus foods. Avoid caffeine and carbonated beverages. Avoid chocolate. Try eating 4-6 small meals a day rather than 3 large meals. Do not eat within 3 hours of laying down. Prop head of bed up on wood or bricks to create a 6 inch incline.   For mild constipation: Start a daily fiber supplement such as Benefiber, 2 to 3 teaspoons daily.  You can get this from [place], [facility], [facility].  You do not have to get namebrand, generic is fine. Avoid straining if possible.  We will get you set up for CT scan of the abdomen pelvis to further evaluate you for potential abdominal wall hernia and check for any cause of mid abdominal pain.  Follow-up in 3 months.  We will be in touch with results once we have received them.  It was a pleasure to see you today. I want to create trusting relationships with patients. If you receive a survey regarding your visit,  I greatly appreciate you taking time to fill this out on paper or through your MyChart. I value your feedback.  Brooke Bonito, MSN, FNP-BC, AGACNP-BC [facility]

## 2024-01-19 NOTE — Telephone Encounter (Signed)
[employer] PA:  Date of Service: Not provided   CPT Code: 16109 Description: CT ABDOMEN & PELVIS W/ Authorization Number: [id number] Case Number: [id number] Review Date: 01/19/2024 8:59:09 AM Expiration Date: 02/18/2024 Status: Your case has been Approved. The prior authorization you submitted, Case [id number], has been received

## 2024-01-24 NOTE — Telephone Encounter (Signed)
 See previous TE

## 2024-01-30 ENCOUNTER — Ambulatory Visit (HOSPITAL_COMMUNITY)
Admission: RE | Admit: 2024-01-30 | Discharge: 2024-01-30 | Disposition: A | Discharge status: Home / Self Care | Payer: MEDICAID | Source: Ambulatory Visit | Attending: Gastroenterology | Admitting: Gastroenterology

## 2024-01-30 DIAGNOSIS — R1033 Periumbilical pain: Secondary | ICD-10-CM | POA: Insufficient documentation

## 2024-01-30 DIAGNOSIS — R112 Nausea with vomiting, unspecified: Secondary | ICD-10-CM | POA: Insufficient documentation

## 2024-01-30 DIAGNOSIS — R1013 Epigastric pain: Secondary | ICD-10-CM | POA: Insufficient documentation

## 2024-01-30 DIAGNOSIS — K219 Gastro-esophageal reflux disease without esophagitis: Secondary | ICD-10-CM | POA: Diagnosis present

## 2024-01-30 MED ORDER — IOHEXOL 300 MG/ML  SOLN
100.0000 mL | Freq: Once | INTRAMUSCULAR | Status: AC | PRN
  Administered 2024-01-30: 100 mL via INTRAVENOUS

## 2024-03-14 ENCOUNTER — Encounter: Payer: Self-pay | Admitting: Gastroenterology

## 2024-04-05 NOTE — Progress Notes (Deleted)
 GI Office Note    Referring Provider: Joelin, Vineetha, NP Primary Care Physician:  Ashley Brady, Vineetha, NP Primary Gastroenterologist: Ashley Brady. Ashley April, DO  Date:  04/10/2024  ID:  Ashley Brady, DOB Jan 27, 1996, MRN [id number]   Chief Complaint   No chief complaint on file.  History of Present Illness  Ashley Brady is a 28 y.o. female with a history of anxiety/depression and constipation presenting today for follow-up of epigastric discomfort, GERD, and constipation.  Most recent visit with PCP she reported Stomach discomfort and feeling of her stomach flipping over but also has had increased anxiety recently as well. Reported stomach not the same since C section 2 years oriur, She also reported waking up with acid to the point where it made her sick. Had also reported nausea that zofran  was not helping muhc. Tums helps with acid feeling.  Last office visit 01/19/24.  For outpatient appointment she reportedly wanting to try more natural methods to help with her symptoms.  Having mild constipation with [place] 2-3 stools and having to sit longer on the commode despite trying squatty potty and trying to avoid straining.  Reported many years of burning sensation in the lower abdomen as well as intermittent acid reflux symptoms which have been causing mild nausea. H. pylori breath test.  Use famotidine 20 mg daily as needed.  CT A/P ordered.  Advised to start Benefiber 2-3 teaspoons daily.  CT A/P 01/30/2024 impression; - No acute abnormality in the abdomen or pelvis - Tiny fat-containing umbilical and periumbilical hernias - Probable postsurgical change in the lower abdominal wall with focus of fat herniating along the right rectus abdominis muscle - Moderate volume of stool in the ascending and transverse colon - Multilevel Schmorl's node formation  Today:    Wt Readings from Last 3 Encounters:  01/19/24 188 lb 6.4 oz (85.5 kg)  03/18/20 220 lb (99.8 kg)  09/16/16 214 lb (97.1  kg)    Current Outpatient Medications  Medication Sig Dispense Refill   ibuprofen  (ADVIL ) 600 MG tablet Take 600 mg by mouth.     amphetamine-dextroamphetamine (ADDERALL) 20 MG tablet Take by mouth.     PARoxetine  (PAXIL ) 20 MG tablet Take 1 tablet (20 mg total) by mouth daily. (Patient not taking: Reported on 01/19/2024) 30 tablet 0   No current facility-administered medications for this visit.    Past Medical History:  Diagnosis Date   Anxiety    Depression    Post partum depression    Recurrent UTI    Trichotillomania     Past Surgical History:  Procedure Laterality Date   ADENOIDECTOMY     CESAREAN SECTION     TONSILLECTOMY      Family History  Problem Relation Age of Onset   Thyroid  disease Mother    Gallbladder disease Mother    Other Father        overdose   Diabetes Maternal Grandmother    Melanoma Maternal Grandmother    Stroke Maternal Grandfather    Other Maternal Great-grandmother        colon surgery and ostomy?   Colonic polyp Neg Hx    Colon cancer Neg Hx     Allergies as of 04/10/2024 - Review Complete 01/19/2024  Allergen Reaction Noted   Sulfa antibiotics  11/16/2013    Social History   Socioeconomic History   Marital status: Single    Spouse name: Not on file   Number of children: Not on file   Years of education:  Not on file   Highest education level: Not on file  Occupational History   Not on file  Tobacco Use   Smoking status: Never   Smokeless tobacco: Never  Substance and Sexual Activity   Alcohol use: No   Drug use: No   Sexual activity: Yes    Birth control/protection: None  Other Topics Concern   Not on file  Social History Narrative   Not on file   Social Drivers of Health   Financial Resource Strain: Not on file  Food Insecurity: Not on file  Transportation Needs: Not on file  Physical Activity: Not on file  Stress: Not on file  Social Connections: Not on file     Review of Systems   Gen: Denies fever,  chills, anorexia. Denies fatigue, weakness, weight loss.  CV: Denies chest pain, palpitations, syncope, peripheral edema, and claudication. Resp: Denies dyspnea at rest, cough, wheezing, coughing up blood, and pleurisy. GI: See HPI *** Derm: Denies rash, itching, dry skin Psych: Denies depression, anxiety, memory loss, confusion. No homicidal or suicidal ideation.  Heme: Denies bruising, bleeding, and enlarged lymph nodes.  Physical Exam   There were no vitals taken for this visit.  General:   Alert and oriented. No distress noted. Pleasant and cooperative.  Head:  Normocephalic and atraumatic. Eyes:  Conjuctiva clear without scleral icterus. Mouth:  Oral mucosa pink and moist. Good dentition. No lesions. Lungs:  Clear to auscultation bilaterally. No wheezes, rales, or rhonchi. No distress.  Heart:  S1, S2 present without murmurs appreciated.  Abdomen:  +BS, soft, non-tender and non-distended. No rebound or guarding. No HSM or masses noted. Rectal: *** Msk:  Symmetrical without gross deformities. Normal posture. Extremities:  Without edema. Neurologic:  Alert and  oriented x4 Psych:  Alert and cooperative. Normal mood and affect.  Assessment  Ashley Brady is a 28 y.o. female with a history of anxiety/depression and constipation presenting today with ***  GERD, epigastric discomfort, nausea, periumbilical pain, multiple abdominal hernias:  Constipation:  PLAN   ***     Julian Obey, MSN, FNP-BC, AGACNP-BC [facility]

## 2024-04-10 ENCOUNTER — Ambulatory Visit: Payer: MEDICAID | Admitting: Gastroenterology

## 2024-04-11 ENCOUNTER — Encounter: Payer: Self-pay | Admitting: Gastroenterology

## 2024-12-10 ENCOUNTER — Emergency Department (HOSPITAL_COMMUNITY)
Admission: EM | Admit: 2024-12-10 | Discharge: 2024-12-10 | Disposition: A | Discharge status: Home / Self Care | Payer: MEDICAID | Attending: Emergency Medicine | Admitting: Emergency Medicine

## 2024-12-10 ENCOUNTER — Encounter (HOSPITAL_COMMUNITY): Payer: Self-pay

## 2024-12-10 ENCOUNTER — Emergency Department (HOSPITAL_COMMUNITY): Payer: MEDICAID

## 2024-12-10 DIAGNOSIS — M436 Torticollis: Secondary | ICD-10-CM | POA: Diagnosis not present

## 2024-12-10 DIAGNOSIS — J0100 Acute maxillary sinusitis, unspecified: Secondary | ICD-10-CM | POA: Diagnosis not present

## 2024-12-10 DIAGNOSIS — R519 Headache, unspecified: Secondary | ICD-10-CM | POA: Diagnosis present

## 2024-12-10 LAB — BASIC METABOLIC PANEL WITH GFR
Anion gap: 13 (ref 5–15)
BUN: 10 mg/dL (ref 6–20)
CO2: 22 mmol/L (ref 22–32)
Calcium: 9.2 mg/dL (ref 8.9–10.3)
Chloride: 104 mmol/L (ref 98–111)
Creatinine, Ser: 0.78 mg/dL (ref 0.44–1.00)
GFR, Estimated: >60 mL/min
Glucose, Bld: 84 mg/dL (ref 70–99)
Potassium: 4 mmol/L (ref 3.5–5.1)
Sodium: 139 mmol/L (ref 135–145)

## 2024-12-10 LAB — CBC WITH DIFFERENTIAL/PLATELET
Abs Immature Granulocytes: 0.02 K/uL (ref 0.00–0.07)
Basophils Absolute: 0.1 K/uL (ref 0.0–0.1)
Basophils Relative: 1 %
Eosinophils Absolute: 0.2 K/uL (ref 0.0–0.5)
Eosinophils Relative: 3 %
HCT: 34.7 % — ABNORMAL LOW (ref 36.0–46.0)
Hemoglobin: 10.8 g/dL — ABNORMAL LOW (ref 12.0–15.0)
Immature Granulocytes: 0 %
Lymphocytes Relative: 24 %
Lymphs Abs: 1.9 K/uL (ref 0.7–4.0)
MCH: 26.3 pg (ref 26.0–34.0)
MCHC: 31.1 g/dL (ref 30.0–36.0)
MCV: 84.6 fL (ref 80.0–100.0)
Monocytes Absolute: 0.7 K/uL (ref 0.1–1.0)
Monocytes Relative: 9 %
Neutro Abs: 5 K/uL (ref 1.7–7.7)
Neutrophils Relative %: 63 %
Platelets: 249 K/uL (ref 150–400)
RBC: 4.1 MIL/uL (ref 3.87–5.11)
RDW: 15.4 % (ref 11.5–15.5)
WBC: 7.9 K/uL (ref 4.0–10.5)
nRBC: 0 % (ref 0.0–0.2)

## 2024-12-10 MED ORDER — AMOXICILLIN-POT CLAVULANATE 875-125 MG PO TABS
1.0000 | ORAL_TABLET | Freq: Once | ORAL | Status: AC
  Administered 2024-12-10: 1 via ORAL
  Filled 2024-12-10: qty 1

## 2024-12-10 MED ORDER — AMOXICILLIN-POT CLAVULANATE 875-125 MG PO TABS: 1.0000 | ORAL_TABLET | Freq: Two times a day (BID) | ORAL | 0 refills | Status: AC

## 2024-12-10 NOTE — Discharge Instructions (Addendum)
 You were seen in the ER today for concerns of facial pain and neck pain. Your labs and imaging were largely reassuring but I suspect you likely have a sinus infection in the right maxillary sinus. I have started you on antibiotics which you should take as prescribed for the next week. Follow up with your primary care provider and ENT for further evaluation. Return to the ER for any concerns of new or worsening symptoms.

## 2024-12-10 NOTE — ED Triage Notes (Signed)
 Pt c/o stiff neck starting yesterday and getting worse today and swelling/pain in R sinus area under R eye starting today.  Pt reports issues w/ sinuses in the past.  Sts she had a CT of her face x2 years ago and they found something in the sinus but wasn't sure what it was.  Pt reports difficulty swallowing xa while and sts it feels like food gets pushed into my nasal passage.

## 2024-12-10 NOTE — ED Provider Notes (Signed)
 " [place] EMERGENCY DEPARTMENT AT [facility] Provider Note   CSN: [id number] Arrival date & time: 12/10/24  8197     Patient presents with: Torticollis and Facial Pain   Ashley Brady is a 29 y.o. female.  Patient with past history significant for tonsillectomy, adenoidectomy presents to the emergency department concerns of facial pain or neck pain.  Reports feelings of right maxillary sinus pain, swelling, and eye discomfort for the last several days.  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  She reports she has a follow-up visit with ENT in the next several weeks.  Denies recent fever, chills or bodyaches.  Does endorse some ongoing dental pain to the right side.  HPI     Prior to Admission medications  Medication Sig Start Date End Date Taking? Authorizing Provider  amoxicillin -clavulanate (AUGMENTIN ) 875-125 MG tablet Take 1 tablet by mouth every 12 (twelve) hours for 7 days. 12/10/24 12/17/24 Yes Lavida Patch A, PA-C  amphetamine-dextroamphetamine (ADDERALL) 20 MG tablet Take by mouth. 11/24/22   [provider]  ibuprofen  (ADVIL ) 600 MG tablet Take 600 mg by mouth. 08/14/21   [provider]  PARoxetine  (PAXIL ) 20 MG tablet Take 1 tablet (20 mg total) by mouth daily. Patient not taking: Reported on 01/19/2024 03/18/20   Dean Clarity, MD    Allergies: Sulfa antibiotics    Review of Systems  HENT:  Positive for sinus pressure and sinus pain.   All other systems reviewed and are negative.   Updated Vital Signs BP 130/83   Pulse 83   Temp 97.7 F (36.5 C)   Resp 18   Ht 5' 9 (1.753 m)   Wt 85.3 kg   SpO2 98%   BMI 27.76 kg/m   Physical Exam Vitals and nursing note reviewed.  Constitutional:      General: She is not in acute distress.    Appearance: She is well-developed.  HENT:     Head: Normocephalic and atraumatic.       Comments: TTP along the right maxillary sinus. Eyes:     Conjunctiva/sclera: Conjunctivae normal.  Cardiovascular:     Rate and Rhythm: Normal rate and regular rhythm.     Heart sounds: No murmur heard. Pulmonary:     Effort: Pulmonary effort is normal. No respiratory distress.     Breath sounds: Normal breath sounds.  Abdominal:     Palpations: Abdomen is soft.     Tenderness: There is no abdominal tenderness.  Musculoskeletal:        General: No swelling.     Cervical back: Neck supple.  Skin:    General: Skin is warm and dry.     Capillary Refill: Capillary refill takes less than 2 seconds.  Neurological:     Mental Status: She is alert.  Psychiatric:        Mood and Affect: Mood normal.     (all labs ordered are listed, but only abnormal results are displayed) Labs Reviewed  CBC WITH DIFFERENTIAL/PLATELET - Abnormal; Notable for the following components:      Result Value   Hemoglobin 10.8 (*)    HCT 34.7 (*)    All other components within normal limits  BASIC METABOLIC PANEL WITH GFR    EKG: None  Radiology: CT Head Wo Contrast Result Date: 12/10/2024 EXAM: CT HEAD WITHOUT 12/10/2024 09:28:51 PM TECHNIQUE: CT of the head was performed  without the administration of intravenous contrast. Automated exposure control, iterative reconstruction, and/or weight based adjustment of the mA/kV was utilized to reduce the radiation dose to as low as reasonably achievable. COMPARISON: None available. CLINICAL HISTORY: Headache, increasing frequency or severity. FINDINGS: BRAIN AND VENTRICLES: No acute intracranial hemorrhage. No mass effect or midline shift. No extra-axial fluid collection. No evidence of acute infarct. No hydrocephalus. ORBITS: No acute abnormality in the partially visualized orbits . SINUSES AND MASTOIDS: There is mild membrane thickening in the medial aspect of the left sphenoid sinus. Other visualized sinuses, both middle ear cavities, and the visualized mastoid air  cells are clear. SOFT TISSUES AND SKULL: No acute skull fracture. No acute soft tissue abnormality. IMPRESSION: 1. No acute intracranial CT findings. Electronically signed by: Francis Quam MD 12/10/2024 09:38 PM EST RP Workstation: [id number]     Procedures   Medications Ordered in the ED  amoxicillin -clavulanate (AUGMENTIN ) 875-125 MG per tablet 1 tablet (has no administration in time range)                                    Medical Decision Making Amount and/or Complexity of Data Reviewed Labs: ordered. Radiology: ordered.  Risk Prescription drug management.   This patient presents to the ED for concern of sinus pain, neck pain.  Differential diagnosis includes acute sinusitis, chronic sinusitis, cervical strain    Additional history obtained:  Additional history obtained from chart review   Lab Tests:  I Ordered, and personally interpreted labs.  The pertinent results include: CBC and BMP unremarkable   Imaging Studies ordered:  I ordered imaging studies including CT head I independently visualized and interpreted imaging which showed no acute findings I agree with the radiologist interpretation   Medicines ordered and prescription drug management:  I ordered medication including Augmentin   for sinusitis  Reevaluation of the patient after these medicines showed that the patient stayed the same I have reviewed the patients home medicines and have made adjustments as needed   Problem List / ED Course:  Patient with past history significant for tonsillectomy, adenoidectomy presents to the emergency department with concerns of facial pain and neck pain.  Reports worsening right maxillary sinus pain over the last several days.  Reports this has been an ongoing chronic issue for the last several months and has an appointment with ENT scheduled for the next few weeks for follow-up.  She denies any prior sinus surgery.  No fever, chills or bodyaches.  Denies any sick  contacts. Exam reveals tenderness to the right maxillary sinus.  Left side unremarkable.  Frontal sinuses unremarkable.  Oropharynx is unremarkable with no appreciable erythema, swelling, or exudate.  Tonsils are surgically absent. CBC and BMP unremarkable. CT head negative for any acute findings. Based on exam and symptom history, suspect likely sinusitis that has gone untreated as patient not been evaluated for this exact concern. No evidence of foreign body. Will start patient on a course of Augmentin  to address likely sinus infection. Return precautions discussed. Patient will follow up with ENT on scheduled appointment on 12/25/2024. She is discharged home in stable condition.   Social Determinants of Health:  None  Final diagnoses:  Acute non-recurrent maxillary sinusitis    ED Discharge Orders          Ordered    amoxicillin -clavulanate (AUGMENTIN ) 875-125 MG tablet  Every 12 hours        12/10/24 2301  Trisha Ken A, PA-C 12/10/24 2303    Patsey Lot, MD 12/10/24 2312  "
